# Patient Record
Sex: Female | Born: 1985 | Race: Black or African American | Hispanic: No | Marital: Single | Smoking: Never smoker
Health system: Southern US, Community
[De-identification: ages and names within clinical notes are randomized; demographics above are authoritative.]

## PROBLEM LIST (undated history)

## (undated) ENCOUNTER — Inpatient Hospital Stay (HOSPITAL_COMMUNITY): Payer: Self-pay

## (undated) HISTORY — PX: FOOT SURGERY: SHX648

---

## 2016-06-15 ENCOUNTER — Encounter: Payer: Self-pay | Admitting: Family Medicine

## 2016-06-15 ENCOUNTER — Ambulatory Visit (HOSPITAL_COMMUNITY)
Admission: EM | Admit: 2016-06-15 | Discharge: 2016-06-15 | Disposition: A | Discharge status: Home / Self Care | Payer: Medicaid Other

## 2016-06-15 ENCOUNTER — Ambulatory Visit (INDEPENDENT_AMBULATORY_CARE_PROVIDER_SITE_OTHER): Payer: Self-pay | Admitting: *Deleted

## 2016-06-15 DIAGNOSIS — Z3201 Encounter for pregnancy test, result positive: Secondary | ICD-10-CM

## 2016-06-15 DIAGNOSIS — Z3200 Encounter for pregnancy test, result unknown: Secondary | ICD-10-CM

## 2016-06-15 LAB — POCT PREGNANCY, URINE: Preg Test, Ur: POSITIVE — AB

## 2016-06-15 NOTE — Progress Notes (Signed)
Pt informed of +UPT today.  LMP 05/15/16 (normal).  EDD 02/19/17.  Pt states she last had intercourse (unprotected) on 05/03/16. She took Plan B within 24 hrs of the episode. Medication reconciliation completed. She is new to the area and desires prenatal care in this office.

## 2016-06-23 ENCOUNTER — Inpatient Hospital Stay (HOSPITAL_COMMUNITY)
Admission: AD | Admit: 2016-06-23 | Discharge: 2016-06-24 | Disposition: A | Discharge status: Home / Self Care | Payer: Medicaid Other | Source: Ambulatory Visit | Attending: Obstetrics & Gynecology | Admitting: Obstetrics & Gynecology

## 2016-06-23 ENCOUNTER — Encounter (HOSPITAL_COMMUNITY): Payer: Self-pay | Admitting: Vascular Surgery

## 2016-06-23 DIAGNOSIS — O00101 Right tubal pregnancy without intrauterine pregnancy: Secondary | ICD-10-CM | POA: Diagnosis present

## 2016-06-23 LAB — URINALYSIS, ROUTINE W REFLEX MICROSCOPIC
Bilirubin Urine: NEGATIVE
Glucose, UA: NEGATIVE mg/dL
KETONES UR: NEGATIVE mg/dL
Leukocytes, UA: NEGATIVE
Nitrite: NEGATIVE
Protein, ur: NEGATIVE mg/dL
Specific Gravity, Urine: 1.023 (ref 1.005–1.030)
pH: 6 (ref 5.0–8.0)

## 2016-06-23 LAB — COMPREHENSIVE METABOLIC PANEL
ALBUMIN: 3.9 g/dL (ref 3.5–5.0)
ALT: 12 U/L — AB (ref 14–54)
AST: 20 U/L (ref 15–41)
Alkaline Phosphatase: 47 U/L (ref 38–126)
Anion gap: 11 (ref 5–15)
BUN: 10 mg/dL (ref 6–20)
CHLORIDE: 99 mmol/L — AB (ref 101–111)
CO2: 25 mmol/L (ref 22–32)
Calcium: 9.5 mg/dL (ref 8.9–10.3)
Creatinine, Ser: 0.94 mg/dL (ref 0.44–1.00)
GFR calc non Af Amer: >60 mL/min (ref >60)
Glucose, Bld: 92 mg/dL (ref 65–99)
Potassium: 3.7 mmol/L (ref 3.5–5.1)
SODIUM: 135 mmol/L (ref 135–145)
Total Bilirubin: 1.2 mg/dL (ref 0.3–1.2)
Total Protein: 6.8 g/dL (ref 6.5–8.1)

## 2016-06-23 LAB — CBC
HCT: 38.2 % (ref 36.0–46.0)
HEMOGLOBIN: 13.2 g/dL (ref 12.0–15.0)
MCH: 31.9 pg (ref 26.0–34.0)
MCHC: 34.6 g/dL (ref 30.0–36.0)
MCV: 92.3 fL (ref 78.0–100.0)
PLATELETS: 304 10*3/uL (ref 150–400)
RBC: 4.14 MIL/uL (ref 3.87–5.11)
RDW: 13.6 % (ref 11.5–15.5)
WBC: 6 10*3/uL (ref 4.0–10.5)

## 2016-06-23 NOTE — ED Triage Notes (Signed)
Pt reports that today she started having some vaginal bleeding around 13:30. She states that she found out she was pregnant last week and she has been taking 3 weeks of birth control up until she found out last week. She denies any N/V. She does have some lower abd cramps and low back pain which are normal when she was premenstrual.

## 2016-06-24 ENCOUNTER — Telehealth (HOSPITAL_COMMUNITY): Payer: Self-pay

## 2016-06-24 ENCOUNTER — Emergency Department (HOSPITAL_COMMUNITY): Payer: Medicaid Other

## 2016-06-24 ENCOUNTER — Other Ambulatory Visit: Payer: Self-pay | Admitting: Student

## 2016-06-24 DIAGNOSIS — A749 Chlamydial infection, unspecified: Secondary | ICD-10-CM

## 2016-06-24 DIAGNOSIS — O00101 Right tubal pregnancy without intrauterine pregnancy: Secondary | ICD-10-CM

## 2016-06-24 LAB — WET PREP, GENITAL
SPERM: NONE SEEN
TRICH WET PREP: NONE SEEN
Yeast Wet Prep HPF POC: NONE SEEN

## 2016-06-24 LAB — ABO/RH: ABO/RH(D): A POS

## 2016-06-24 LAB — GC/CHLAMYDIA PROBE AMP (~~LOC~~) NOT AT ARMC
Chlamydia: POSITIVE — AB
Neisseria Gonorrhea: NEGATIVE

## 2016-06-24 LAB — HCG, QUANTITATIVE, PREGNANCY: HCG, BETA CHAIN, QUANT, S: 2451 m[IU]/mL — AB (ref <5)

## 2016-06-24 MED ORDER — METHOTREXATE INJECTION FOR WOMEN'S HOSPITAL
50.0000 mg/m2 | Freq: Once | INTRAMUSCULAR | Status: AC
  Administered 2016-06-24: 90 mg via INTRAMUSCULAR
  Filled 2016-06-24: qty 1.8

## 2016-06-24 MED ORDER — AZITHROMYCIN 250 MG PO TABS: 1000.0000 mg | ORAL_TABLET | Freq: Once | ORAL | 0 refills | Status: AC

## 2016-06-24 NOTE — Discharge Instructions (Signed)
Ectopic Pregnancy °An ectopic pregnancy is when the fertilized egg attaches (implants) outside the uterus. Most ectopic pregnancies occur in one of the tubes where eggs travel from the ovary to the uterus (fallopian tubes), but the implanting can occur in other locations. In rare cases, ectopic pregnancies occur on the ovary, intestine, pelvis, abdomen, or cervix. In an ectopic pregnancy, the fertilized egg does not have the ability to develop into a normal, healthy baby. °A ruptured ectopic pregnancy is one in which tearing or bursting of a fallopian tube causes internal bleeding. Often, there is intense lower abdominal pain, and vaginal bleeding sometimes occurs. Having an ectopic pregnancy can be life-threatening. If this dangerous condition is not treated, it can lead to blood loss, shock, or even death. °What are the causes? °The most common cause of this condition is damage to one of the fallopian tubes. A fallopian tube may be narrowed or blocked, and that keeps the fertilized egg from reaching the uterus. °What increases the risk? °This condition is more likely to develop in women of childbearing age who have different levels of risk. The levels of risk can be divided into three categories. °High risk  °· You have gone through infertility treatment. °· You have had an ectopic pregnancy before. °· You have had surgery on the fallopian tubes, or another surgical procedure, such as an abortion. °· You have had surgery to have the fallopian tubes tied (tubal ligation). °· You have problems or diseases of the fallopian tubes. °· You have been exposed to diethylstilbestrol (DES). This medicine was used until 1971, and it had effects on babies whose mothers took the medicine. °· You become pregnant while using an IUD (intrauterine device) for birth control. °Moderate risk  °· You have a history of infertility. °· You have had an STI (sexually transmitted infection). °· You have a history of pelvic inflammatory  disease (PID). °· You have scarring from endometriosis. °· You have multiple sexual partners. °· You smoke. °Low risk  °· You have had pelvic surgery. °· You use vaginal douches. °· You became sexually active before age 18. °What are the signs or symptoms? °Common symptoms of this condition include normal pregnancy symptoms, such as missing a period, nausea, tiredness, abdominal pain, breast tenderness, and bleeding. However, ectopic pregnancy will have additional symptoms, such as: °· Pain with intercourse. °· Irregular vaginal bleeding or spotting. °· Cramping or pain on one side or in the lower abdomen. °· Fast heartbeat, low blood pressure, and sweating. °· Passing out while having a bowel movement. °Symptoms of a ruptured ectopic pregnancy and internal bleeding may include: °· Sudden, severe pain in the abdomen and pelvis. °· Dizziness, weakness, light-headedness, or fainting. °· Pain in the shoulder or neck area. °How is this diagnosed? °This condition is diagnosed by: °· A pelvic exam to locate pain or a mass in the abdomen. °· A pregnancy test. This blood test checks for the presence as well as the specific level of pregnancy hormone in the bloodstream. °· Ultrasound. This is performed if a pregnancy test is positive. In this test, a probe is inserted into the vagina. The probe will detect a fetus, possibly in a location other than the uterus. °· Taking a sample of uterus tissue (dilation and curettage, or D&C). °· Surgery to perform a visual exam of the inside of the abdomen using a thin, lighted tube that has a tiny camera on the end (laparoscope). °· Culdocentesis. This procedure involves inserting a needle at the   top of the vagina, behind the uterus. If blood is present in this area, it may indicate that a fallopian tube is torn. How is this treated? This condition is treated with medicine or surgery. Medicine   An injection of a medicine (methotrexate) may be given to cause the pregnancy tissue to  be absorbed. This medicine may save your fallopian tube. It may be given if:  The diagnosis is made early, with no signs of active bleeding.  The fallopian tube has not ruptured.  You are considered to be a good candidate for the medicine. Usually, pregnancy hormone blood levels are checked after methotrexate treatment. This is to be sure that the medicine is effective. It may take 4-6 weeks for the pregnancy to be absorbed. Most pregnancies will be absorbed by 3 weeks. Surgery   A laparoscope may be used to remove the pregnancy tissue.  If severe internal bleeding occurs, a larger cut (incision) may be made in the lower abdomen (laparotomy) to remove the fetus and placenta. This is done to stop the bleeding.  Part or all of the fallopian tube may be removed (salpingectomy) along with the fetus and placenta. The fallopian tube may also be repaired during the surgery.  In very rare circumstances, removal of the uterus (hysterectomy) may be required.  After surgery, pregnancy hormone testing may be done to be sure that there is no pregnancy tissue left. Whether your treatment is medicine or surgery, you may receive a Rho (D) immune globulin shot to prevent problems with any future pregnancy. This shot may be given if:  You are Rh-negative and the baby's father is Rh-positive.  You are Rh-negative and you do not know the Rh type of the baby's father. Follow these instructions at home:  Rest and limit your activity after the procedure for as long as told by your health care provider.  Until your health care provider says that it is safe:  Do not lift anything that is heavier than 10 lb (4.5 kg), or the limit that your health care provider tells you.  Avoid physical exercise and any movement that requires effort (is strenuous).  To help prevent constipation:  Eat a healthy diet that includes fruits, vegetables, and whole grains.  Drink 6-8 glasses of water per day. Get help right  away if:  You develop worsening pain that is not relieved by medicine.  You have:  A fever or chills.  Vaginal bleeding.  Redness and swelling at the incision site.  Nausea and vomiting.  You feel dizzy or weak.  You feel light-headed or you faint. This information is not intended to replace advice given to you by your health care provider. Make sure you discuss any questions you have with your health care provider. Document Released: 04/07/2004 Document Revised: 10/28/2015 Document Reviewed: 09/30/2015 Elsevier Interactive Patient Education  2017 [employer]   Methotrexate Treatment for an Ectopic Pregnancy Methotrexate is a medicine that treats a pregnancy condition in which the fetus develops outside the uterus (ectopic pregnancy) by stopping the growth of the fertilized egg. It also helps your body absorb tissue from the egg. This takes between 2 weeks and 6 weeks. Most ectopic pregnancies can be successfully treated with methotrexate if they are detected early enough. Tell a health care provider about:  Any allergies you have.  All medicines you are taking, including vitamins, herbs, eye drops, creams, and over-the-counter medicines.  Any medical conditions you have. What are the risks? Generally, this is a safe  treatment. However, problems can occur, including:  Nausea.  Vomiting.  Diarrhea.  Abdominal cramping.  Mouth sores.  Increased vaginal bleeding or spotting.  Swelling or irritation of the lining of your lungs (pneumonitis).  Failed treatment and continuation of the pregnancy.  Liver damage.  Hair loss. There is still a risk of the ectopic pregnancy rupturing while using the methotrexate. What happens before the procedure? Before you take the medicine:  Liver tests, kidney tests, and a complete blood test are performed.  Blood tests are performed to measure the pregnancy hormone levels and to determine your blood type.  If you are  Rh-negative and the father is Rh-positive or his Rh type is not known, you will be given a Rho (D) immune globulin shot. What happens during the procedure? There are two methods that your health care provider may use to give you methotrexate.  One method involves a single dose or injection of the medicine.  Another method involves a series of doses given through several injections. What happens after the procedure?  You may have some abdominal cramping, vaginal bleeding, and fatigue in the first few days after taking methotrexate.  Blood tests will be taken for several weeks to check the pregnancy hormone levels. The blood tests are performed until there is no more pregnancy hormone detected in the blood. This information is not intended to replace advice given to you by your health care provider. Make sure you discuss any questions you have with your health care provider. Document Released: 02/22/2001 Document Revised: 08/06/2015 Document Reviewed: 12/17/2012 Elsevier Interactive Patient Education  2017 [employer].  Methotrexate Treatment for an Ectopic Pregnancy, Care After Refer to this sheet in the next few weeks. These instructions provide you with information on caring for yourself after your procedure. Your health care provider may also give you more specific instructions. Your treatment has been planned according to current medical practices, but problems sometimes occur. Call your health care provider if you have any problems or questions after your procedure. What can I expect after the procedure? You may have some abdominal cramping, vaginal bleeding, and fatigue in the first few days after taking methotrexate. Some other possible side effects of methotrexate include:  Nausea.  Vomiting.  Diarrhea.  Mouth sores.  Swelling or irritation of the lining of your lungs (pneumonitis).  Liver damage.  Hair loss. Follow these instructions at home: After you have received the  methotrexate medicine, you need to be careful of your activities and watch your condition for several weeks. It may take 1 week before your hormone levels return to normal. Activity   Do not have sexual intercourse until your health care provider says it is safe to do so.  You may resume your usual diet.  Limit strenuous activity.  Do not drink alcohol. General instructions   Do not take aspirin, ibuprofen, or naproxen (nonsteroidal anti-inflammatory drugs [NSAIDs]).  Do not take folic acid, prenatal vitamins, or other vitamins that contain folic acid.  Avoid traveling too far away from your health care provider.  Keep all follow-up visits as told by your health care provider. This is important. Contact a health care provider if:  You cannot control your nausea and vomiting.  You cannot control your diarrhea.  You have sores in your mouth and want treatment.  You need pain medicine for your abdominal pain.  You have a rash.  You are having a reaction to the medicine. Get help right away if:  You have increasing abdominal  or pelvic pain.  You notice increased bleeding.  You feel light-headed, or you faint.  You have shortness of breath.  Your heart rate increases.  You have a cough.  You have chills.  You have a fever. This information is not intended to replace advice given to you by your health care provider. Make sure you discuss any questions you have with your health care provider. Document Released: 02/17/2011 Document Revised: 08/06/2015 Document Reviewed: 12/17/2012 Elsevier Interactive Patient Education  2017 [employer].

## 2016-06-24 NOTE — ED Provider Notes (Signed)
[facility]-EMERGENCY DEPT Provider Note   CSN: [id number] Arrival date & time: 06/23/16  2254     History   Chief Complaint Chief Complaint  Patient presents with  . Vaginal Bleeding    HPI Daisy Cruz is a 31 y.o. G2P1 @  Approximately 4 weeks and 2 days gest. LMP 05/21/16. Patient reports that she had sex with her boyfriend last month and the condom broke but she took plan B within 48 hours. She went to her GYN and they started her on OC's. Patient reports they did not do a pregnancy test prior to starting the pills. Patient felt like she had pregnancy symptoms so took a HPT after she had been on the pills a couple weeks. The test was positive so she stopped the OC's. Patient has now moved to [place] and does not have an OB yet.   HPI  History reviewed. No pertinent past medical history.  There are no active problems to display for this patient.   Past Surgical History:  Procedure Laterality Date  . FOOT SURGERY Bilateral     OB History    Gravida Para Term Preterm AB Living   1             SAB TAB Ectopic Multiple Live Births                   Home Medications    Prior to Admission medications   Not on File    Family History No family history on file.  Social History Social History  Substance Use Topics  . Smoking status: Never Smoker  . Smokeless tobacco: Never Used  . Alcohol use Yes     Comment: occasionally, not since she found out she was preg     Allergies   Patient has no allergy information on record.   Review of Systems Review of Systems  Constitutional: Positive for chills. Negative for fever.  HENT: Negative for congestion, ear pain and sore throat.   Eyes: Negative for visual disturbance.  Respiratory: Negative for chest tightness and shortness of breath.   Cardiovascular: Negative for chest pain and leg swelling.  Gastrointestinal: Positive for abdominal pain, constipation and nausea. Negative for diarrhea and vomiting.    Genitourinary: Positive for vaginal bleeding. Negative for dysuria, frequency and urgency.  Musculoskeletal: Positive for back pain.  Skin: Negative for rash.  Neurological: Positive for light-headedness. Negative for headaches.  Psychiatric/Behavioral: Negative for confusion. The patient is not nervous/anxious.      Physical Exam Updated Vital Signs BP (!) 132/93 (BP Location: Right Arm)   Pulse 90   Temp 98 F (36.7 C) (Oral)   Resp 16   SpO2 99%   Physical Exam  Constitutional: She is oriented to person, place, and time. She appears well-developed and well-nourished. No distress.  HENT:  Head: Normocephalic.  Eyes: EOM are normal.  Neck: Neck supple.  Cardiovascular: Normal rate and regular rhythm.   Pulmonary/Chest: Effort normal and breath sounds normal.  Abdominal: Soft. Bowel sounds are normal.  Lower abdomen tender with palpation, no rebound or guarding.   Genitourinary:  Genitourinary Comments: External genitalia without lesions, small amount of blood vaginal vault, no CMT,mild right adnexal tenderness, uterus not enlarged.   Musculoskeletal: Normal range of motion. She exhibits no deformity.  Neurological: She is alert and oriented to person, place, and time. No cranial nerve deficit.  Skin: Skin is warm and dry.  Psychiatric: She has a normal mood and affect. Her behavior is  normal.  Nursing note and vitals reviewed.    ED Treatments / Results  Labs (all labs ordered are listed, but only abnormal results are displayed) Labs Reviewed  WET PREP, GENITAL - Abnormal; Notable for the following:       Result Value   Clue Cells Wet Prep HPF POC PRESENT (*)    WBC, Wet Prep HPF POC FEW (*)    All other components within normal limits  COMPREHENSIVE METABOLIC PANEL - Abnormal; Notable for the following:    Chloride 99 (*)    ALT 12 (*)    All other components within normal limits  URINALYSIS, ROUTINE W REFLEX MICROSCOPIC - Abnormal; Notable for the following:     APPearance HAZY (*)    Hgb urine dipstick LARGE (*)    Bacteria, UA RARE (*)    Squamous Epithelial / LPF 0-5 (*)    All other components within normal limits  HCG, QUANTITATIVE, PREGNANCY - Abnormal; Notable for the following:    hCG, Beta Chain, Quant, S 2,451 (*)    All other components within normal limits  CBC  ABO/RH  GC/CHLAMYDIA PROBE AMP ([place]) NOT AT [facility]   Radiology US Ob Comp Less 14 Wks  Result Date: 06/24/2016 CLINICAL DATA:  Acute onset of vaginal bleeding and pelvic cramping. Initial encounter. EXAM: OBSTETRIC <14 WK [place] AND TRANSVAGINAL OB US TECHNIQUE: Both transabdominal and transvaginal ultrasound examinations were performed for complete evaluation of the gestation as well as the maternal uterus, adnexal regions, and pelvic cul-de-sac. Transvaginal technique was performed to assess early pregnancy. COMPARISON:  None. FINDINGS: Intrauterine gestational sac:  None seen. Yolk sac:  N/A Embryo:  N/A Subchorionic hemorrhage:  None visualized. Maternal uterus/adnexae: An echogenic masslike focus is seen between the right ovary and the uterus, measuring approximately 2.5 x 1.5 x 1.7 cm. This appears to be separate from the right ovary, and raises concern for an ectopic pregnancy within the right fallopian tube. The uterus is unremarkable in appearance. The right ovary is unremarkable, measuring 3.6 x 2.7 x 2.6 cm. The left ovary is not visualized. No free fluid is seen at this time. IMPRESSION: 1. No intrauterine gestational sac seen at this time. 2. Concern for ectopic pregnancy near the right ovary, possibly within the right fallopian tube. Echogenic masslike focus measures 2.5 x 1.5 x 1.7 cm. No free fluid seen at this time. These results were called by telephone at the time of interpretation on 06/24/2016 at 3:38 am to [facility] NP, who verbally acknowledged these results. Electronically Signed   By: Roanna Raider M.D.   On: 06/24/2016 03:39   US Ob Transvaginal  Result  Date: 06/24/2016 CLINICAL DATA:  Acute onset of vaginal bleeding and pelvic cramping. Initial encounter. EXAM: OBSTETRIC <14 WK [place] AND TRANSVAGINAL OB US TECHNIQUE: Both transabdominal and transvaginal ultrasound examinations were performed for complete evaluation of the gestation as well as the maternal uterus, adnexal regions, and pelvic cul-de-sac. Transvaginal technique was performed to assess early pregnancy. COMPARISON:  None. FINDINGS: Intrauterine gestational sac:  None seen. Yolk sac:  N/A Embryo:  N/A Subchorionic hemorrhage:  None visualized. Maternal uterus/adnexae: An echogenic masslike focus is seen between the right ovary and the uterus, measuring approximately 2.5 x 1.5 x 1.7 cm. This appears to be separate from the right ovary, and raises concern for an ectopic pregnancy within the right fallopian tube. The uterus is unremarkable in appearance. The right ovary is unremarkable, measuring 3.6 x 2.7 x 2.6 cm. The  left ovary is not visualized. No free fluid is seen at this time. IMPRESSION: 1. No intrauterine gestational sac seen at this time. 2. Concern for ectopic pregnancy near the right ovary, possibly within the right fallopian tube. Echogenic masslike focus measures 2.5 x 1.5 x 1.7 cm. No free fluid seen at this time. These results were called by telephone at the time of interpretation on 06/24/2016 at 3:38 am to [facility] NP, who verbally acknowledged these results. Electronically Signed   By: Roanna Raider M.D.   On: 06/24/2016 03:39   3:45 am consult with Dr. Macon Large. Patient will go to [facility] for MTX for treatment of ectopic.   4:00 am spoke with Thressa Sheller, CNM in the MAU and discussed patient dx and treatment plan. She will see the patient in MAU.  Procedures Procedures (including critical care time)  Medications Ordered in ED Medications - No data to display   Initial Impression / Assessment and Plan / ED Course  I have reviewed the triage vital signs and the nursing  notes.  Pertinent labs & imaging results that were available during my care of the patient were reviewed by me and considered in my medical decision making (see chart for details).    Final Clinical Impressions(s) / ED Diagnoses  31 y.o. female with vaginal bleeding, spotting, and mild cramping stable for transfer to [facility] hospital via private car without hemorrhage and without acute abdomen. Discussed in detail with the patient plan of care which includes going to [facility] for treatment of the ectopic pregnancy. Patient voices understanding and agrees with plan. When blood is drawn prior to MTX they will also do a blood type.   Final diagnoses:  Right tubal pregnancy without intrauterine pregnancy    New Prescriptions New Prescriptions   No medications on file     [facility], NP 06/24/16 0429    Layla Maw Ward, DO 06/24/16 [phone]

## 2016-06-24 NOTE — MAU Provider Note (Signed)
History     CSN: [id number]  Arrival date and time: 06/23/16 2254   First Provider Initiated Contact with Patient 06/24/16 [phone]      Chief Complaint  Patient presents with  . Vaginal Bleeding   Vaginal Bleeding  The patient's primary symptoms include pelvic pain and vaginal bleeding. This is a new problem. The current episode started yesterday. Pain severity now: 5/10  The problem affects the right side. Associated symptoms include back pain and nausea. Pertinent negatives include no chills, fever or vomiting. The vaginal discharge was bloody. The vaginal bleeding is lighter than menses. She has not been passing clots. She has not been passing tissue. Nothing aggravates the symptoms. She has tried nothing for the symptoms. She uses oral contraceptives for contraception. Her menstrual history has been regular (LMP 05/22/16 approx. ).    History reviewed. No pertinent past medical history.  Past Surgical History:  Procedure Laterality Date  . FOOT SURGERY Bilateral     No family history on file.  Social History  Substance Use Topics  . Smoking status: Never Smoker  . Smokeless tobacco: Never Used  . Alcohol use Yes     Comment: occasionally, not since she found out she was preg    Allergies: Allergies not on file  No prescriptions prior to admission.    Review of Systems  Constitutional: Negative for chills and fever.  Gastrointestinal: Positive for nausea. Negative for vomiting.  Genitourinary: Positive for pelvic pain and vaginal bleeding.  Musculoskeletal: Positive for back pain.   Physical Exam   Blood pressure 114/74, pulse 76, temperature 98.2 F (36.8 C), resp. rate 16, SpO2 100 %.  Physical Exam  Nursing note and vitals reviewed. Constitutional: She is oriented to person, place, and time. She appears well-developed and well-nourished. No distress.  HENT:  Head: Normocephalic.  Cardiovascular: Normal rate.   Respiratory: Effort normal.  GI: Soft.   Musculoskeletal: Normal range of motion.  Neurological: She is alert and oriented to person, place, and time.  Skin: Skin is warm and dry.  Psychiatric: She has a normal mood and affect.     Results for orders placed or performed during the hospital encounter of 06/23/16 (from the past 24 hour(s))  Comprehensive metabolic panel     Status: Abnormal   Collection Time: 06/23/16 11:04 PM  Result Value Ref Range   Sodium 135 135 - 145 mmol/L   Potassium 3.7 3.5 - 5.1 mmol/L   Chloride 99 (L) 101 - 111 mmol/L   CO2 25 22 - 32 mmol/L   Glucose, Bld 92 65 - 99 mg/dL   BUN 10 6 - 20 mg/dL   Creatinine, Ser 4.69 0.44 - 1.00 mg/dL   Calcium 9.5 8.9 - 62.9 mg/dL   Total Protein 6.8 6.5 - 8.1 g/dL   Albumin 3.9 3.5 - 5.0 g/dL   AST 20 15 - 41 U/L   ALT 12 (L) 14 - 54 U/L   Alkaline Phosphatase 47 38 - 126 U/L   Total Bilirubin 1.2 0.3 - 1.2 mg/dL   GFR calc non Af Amer >60 >60 mL/min   GFR calc Af Amer >60 >60 mL/min   Anion gap 11 5 - 15  CBC     Status: None   Collection Time: 06/23/16 11:04 PM  Result Value Ref Range   WBC 6.0 4.0 - 10.5 K/uL   RBC 4.14 3.87 - 5.11 MIL/uL   Hemoglobin 13.2 12.0 - 15.0 g/dL   HCT 52.8 41.3 - 24.4 %  MCV 92.3 78.0 - 100.0 fL   MCH 31.9 26.0 - 34.0 pg   MCHC 34.6 30.0 - 36.0 g/dL   RDW 16.1 09.6 - 04.5 %   Platelets 304 150 - 400 K/uL  Urinalysis, Routine w reflex microscopic     Status: Abnormal   Collection Time: 06/23/16 11:04 PM  Result Value Ref Range   Color, Urine YELLOW YELLOW   APPearance HAZY (A) CLEAR   Specific Gravity, Urine 1.023 1.005 - 1.030   pH 6.0 5.0 - 8.0   Glucose, UA NEGATIVE NEGATIVE mg/dL   Hgb urine dipstick LARGE (A) NEGATIVE   Bilirubin Urine NEGATIVE NEGATIVE   Ketones, ur NEGATIVE NEGATIVE mg/dL   Protein, ur NEGATIVE NEGATIVE mg/dL   Nitrite NEGATIVE NEGATIVE   Leukocytes, UA NEGATIVE NEGATIVE   RBC / HPF 0-5 0 - 5 RBC/hpf   WBC, UA 0-5 0 - 5 WBC/hpf   Bacteria, UA RARE (A) NONE SEEN   Squamous Epithelial  / LPF 0-5 (A) NONE SEEN   Mucous PRESENT   hCG, quantitative, pregnancy     Status: Abnormal   Collection Time: 06/23/16 11:04 PM  Result Value Ref Range   hCG, Beta Chain, Quant, S 2,451 (H) <5 mIU/mL  Wet prep, genital     Status: Abnormal   Collection Time: 06/24/16 12:40 AM  Result Value Ref Range   Yeast Wet Prep HPF POC NONE SEEN NONE SEEN   Trich, Wet Prep NONE SEEN NONE SEEN   Clue Cells Wet Prep HPF POC PRESENT (A) NONE SEEN   WBC, Wet Prep HPF POC FEW (A) NONE SEEN   Sperm NONE SEEN   ABO/Rh     Status: None   Collection Time: 06/24/16 12:58 AM  Result Value Ref Range   ABO/RH(D) A POS    No rh immune globuloin NOT A RH IMMUNE GLOBULIN CANDIDATE, PT RH POSITIVE    US Ob Comp Less 14 Wks  Result Date: 06/24/2016 CLINICAL DATA:  Acute onset of vaginal bleeding and pelvic cramping. Initial encounter. EXAM: OBSTETRIC <14 WK [place] AND TRANSVAGINAL OB US TECHNIQUE: Both transabdominal and transvaginal ultrasound examinations were performed for complete evaluation of the gestation as well as the maternal uterus, adnexal regions, and pelvic cul-de-sac. Transvaginal technique was performed to assess early pregnancy. COMPARISON:  None. FINDINGS: Intrauterine gestational sac:  None seen. Yolk sac:  N/A Embryo:  N/A Subchorionic hemorrhage:  None visualized. Maternal uterus/adnexae: An echogenic masslike focus is seen between the right ovary and the uterus, measuring approximately 2.5 x 1.5 x 1.7 cm. This appears to be separate from the right ovary, and raises concern for an ectopic pregnancy within the right fallopian tube. The uterus is unremarkable in appearance. The right ovary is unremarkable, measuring 3.6 x 2.7 x 2.6 cm. The left ovary is not visualized. No free fluid is seen at this time. IMPRESSION: 1. No intrauterine gestational sac seen at this time. 2. Concern for ectopic pregnancy near the right ovary, possibly within the right fallopian tube. Echogenic masslike focus measures 2.5 x 1.5  x 1.7 cm. No free fluid seen at this time. These results were called by telephone at the time of interpretation on 06/24/2016 at 3:38 am to [facility] NP, who verbally acknowledged these results. Electronically Signed   By: Roanna Raider M.D.   On: 06/24/2016 03:39   US Ob Transvaginal  Result Date: 06/24/2016 CLINICAL DATA:  Acute onset of vaginal bleeding and pelvic cramping. Initial encounter. EXAM: OBSTETRIC <14 WK [place]  AND TRANSVAGINAL OB US TECHNIQUE: Both transabdominal and transvaginal ultrasound examinations were performed for complete evaluation of the gestation as well as the maternal uterus, adnexal regions, and pelvic cul-de-sac. Transvaginal technique was performed to assess early pregnancy. COMPARISON:  None. FINDINGS: Intrauterine gestational sac:  None seen. Yolk sac:  N/A Embryo:  N/A Subchorionic hemorrhage:  None visualized. Maternal uterus/adnexae: An echogenic masslike focus is seen between the right ovary and the uterus, measuring approximately 2.5 x 1.5 x 1.7 cm. This appears to be separate from the right ovary, and raises concern for an ectopic pregnancy within the right fallopian tube. The uterus is unremarkable in appearance. The right ovary is unremarkable, measuring 3.6 x 2.7 x 2.6 cm. The left ovary is not visualized. No free fluid is seen at this time. IMPRESSION: 1. No intrauterine gestational sac seen at this time. 2. Concern for ectopic pregnancy near the right ovary, possibly within the right fallopian tube. Echogenic masslike focus measures 2.5 x 1.5 x 1.7 cm. No free fluid seen at this time. These results were called by telephone at the time of interpretation on 06/24/2016 at 3:38 am to [facility] NP, who verbally acknowledged these results. Electronically Signed   By: Roanna Raider M.D.   On: 06/24/2016 03:39   MAU Course  Procedures  MDM R/B/A discussed at length with the patient. All questions answered. She agrees to MTX and follow up plan.   MTX given today. FU  Monday for day 4 labs  Assessment and Plan   1. Right tubal pregnancy without intrauterine pregnancy    DC home Comfort measures reviewed  Bleeding precautions Ectopic precautions RX: none  Return to MAU as needed FU with OB as planned  Follow-up Information    [facility].   Specialty:  Obstetrics and Gynecology Why:  Please come to the clinic monday morning around 0800 for repeat blood work.  Contact information: [address] [place] [address] [phone]           Tawnya Crook 06/24/2016, 5:10 AM

## 2016-06-24 NOTE — Telephone Encounter (Signed)

## 2016-06-24 NOTE — MAU Note (Signed)
PT  PRESENTS / TRANSFER   FROM  [facility]  AFTER   LABS AND  U/S -  TO RECEIVE METHOTREXATE

## 2016-06-27 ENCOUNTER — Telehealth: Payer: Self-pay | Admitting: General Practice

## 2016-06-27 ENCOUNTER — Ambulatory Visit: Payer: Medicaid Other | Admitting: General Practice

## 2016-06-27 DIAGNOSIS — O00101 Right tubal pregnancy without intrauterine pregnancy: Secondary | ICD-10-CM

## 2016-06-27 LAB — HCG, QUANTITATIVE, PREGNANCY: HCG, BETA CHAIN, QUANT, S: 1808 m[IU]/mL — AB (ref <5)

## 2016-06-27 NOTE — Telephone Encounter (Signed)
Called and informed patient of results and need to follow up on Thursday. Patient verbalized understanding and asked about starting her birth control. Told patient to wait until we follow her levels all the way down near 0. Patient verbalized understanding and had no questions

## 2016-06-27 NOTE — Progress Notes (Signed)
Patient here for stat bhcg for day 4 labs post MTX. Patient reports no pain but bleeding like a period. Per Donette Larry, patient does not need to wait for results- can be contacted by phone. Informed patient and she provided number [phone] for call back. Per Dr Macon Large, favorable decrease in bhcg levels- will follow up on Thursday for day 7 labs. Will call patient with results

## 2016-06-30 ENCOUNTER — Other Ambulatory Visit: Payer: Medicaid Other

## 2016-06-30 DIAGNOSIS — O3680X0 Pregnancy with inconclusive fetal viability, not applicable or unspecified: Secondary | ICD-10-CM

## 2016-07-01 LAB — BETA HCG QUANT (REF LAB): hCG Quant: 1092 m[IU]/mL

## 2016-07-05 ENCOUNTER — Telehealth: Payer: Self-pay | Admitting: *Deleted

## 2016-07-05 NOTE — Telephone Encounter (Addendum)
-----   Message from Tereso Newcomer, MD sent at 07/03/2016 10:50 AM EDT ----- Adequate drop in HCG.  Follow weekly HCGs until <5.  Please call to inform patient of results and recommendations.  Called pt to inform of the above results and message from Dr. Macon Large. Message left for her to call back and state whether a detailed message can be left on her voicemail.   4/25  1325  Called pt and informed her of BHCG test results from 4/19 and need for weekly repeat of test until the level is <5.  Pt voiced understanding and agreed to appt on 4/26 @ 0800 for BHCG.  She also asked for clarification if she should wait to resume birth control until the level has reduced mor. I advised that she should wait until we have stopped testing her blood (<5) to resume birth control. She also should abstain from sex until that time as well.  Pt voiced understanding.

## 2016-07-07 ENCOUNTER — Other Ambulatory Visit: Payer: Self-pay

## 2016-07-07 DIAGNOSIS — O00101 Right tubal pregnancy without intrauterine pregnancy: Secondary | ICD-10-CM

## 2016-07-08 LAB — BETA HCG QUANT (REF LAB): HCG QUANT: 406 m[IU]/mL

## 2016-07-12 ENCOUNTER — Telehealth: Payer: Self-pay | Admitting: General Practice

## 2016-07-12 NOTE — Telephone Encounter (Signed)
Telephone call to patient regarding BHCG results and need for f/u lab draw this week.No answer left message to call us back.

## 2016-07-19 NOTE — Telephone Encounter (Signed)
Called patient inform her of the test results and recommendation per [facility] patient plans to come in on 07/20/2016 for a repeat BHCG.

## 2016-07-20 ENCOUNTER — Ambulatory Visit: Payer: Medicaid Other | Admitting: *Deleted

## 2016-07-20 DIAGNOSIS — O3680X0 Pregnancy with inconclusive fetal viability, not applicable or unspecified: Secondary | ICD-10-CM

## 2016-07-21 LAB — BETA HCG QUANT (REF LAB): hCG Quant: 54 m[IU]/mL

## 2016-07-25 ENCOUNTER — Telehealth: Payer: Self-pay

## 2016-07-25 NOTE — Telephone Encounter (Signed)
Patient informed of hormone level and the need to come back this Friday 07/29/2016 for a lab draw.

## 2016-07-25 NOTE — Telephone Encounter (Signed)
-----   Message from Marny LowensteinJulie N Wenzel, PA-C sent at 07/21/2016  1:21 PM EDT ----- Patient's hCG continues to drop. Still need for follow until < 5, repeat in 1 week.   Thanks,  Raynelle FanningJulie

## 2016-07-29 ENCOUNTER — Other Ambulatory Visit: Payer: Medicaid Other

## 2016-07-29 DIAGNOSIS — O039 Complete or unspecified spontaneous abortion without complication: Secondary | ICD-10-CM

## 2016-07-30 LAB — BETA HCG QUANT (REF LAB): HCG QUANT: 7 m[IU]/mL

## 2016-08-04 ENCOUNTER — Telehealth: Payer: Self-pay | Admitting: *Deleted

## 2016-08-04 NOTE — Telephone Encounter (Addendum)
-----   Message from [name], MD sent at 08/01/2016  1:01 PM EDT ----- Needs one more HCG lab draw; can do it during appointment with any provider to discuss future pregnancy plans, contraception etc.  Please schedule appointment + lab draw in 1-2 weeks.  Please call to inform patient of results and recommendations.  5/24  1030  Called pt and heard message stating that the number called [phone]) is not reachable. Will try again at another time.   5/25  1135  Called pt and again heard message stating that the number called is not reachable.   5/31  Called pt and informed her of recent lab (BHCG) result. She will need one more check of the hormone level and this can be done at the same time as her follow up appt in the office. We will schedule the appt and notify her of the details.  Pt voiced understanding and requested to have a morning appt on any [name] as she needs to be to work @ 1130.

## 2016-08-17 ENCOUNTER — Encounter: Payer: Self-pay | Admitting: Obstetrics and Gynecology

## 2016-08-17 ENCOUNTER — Ambulatory Visit (INDEPENDENT_AMBULATORY_CARE_PROVIDER_SITE_OTHER): Payer: Medicaid Other | Admitting: Obstetrics and Gynecology

## 2016-08-17 ENCOUNTER — Ambulatory Visit (INDEPENDENT_AMBULATORY_CARE_PROVIDER_SITE_OTHER): Payer: Medicaid Other | Admitting: Clinical

## 2016-08-17 VITALS — BP 111/85 | HR 76 | Ht 63.0 in | Wt 158.7 lb

## 2016-08-17 DIAGNOSIS — F4320 Adjustment disorder, unspecified: Secondary | ICD-10-CM

## 2016-08-17 DIAGNOSIS — O008 Other ectopic pregnancy without intrauterine pregnancy: Secondary | ICD-10-CM | POA: Diagnosis not present

## 2016-08-17 DIAGNOSIS — F4321 Adjustment disorder with depressed mood: Secondary | ICD-10-CM

## 2016-08-17 DIAGNOSIS — O00109 Unspecified tubal pregnancy without intrauterine pregnancy: Secondary | ICD-10-CM

## 2016-08-17 NOTE — Progress Notes (Signed)
States just had a period and now no bleeding and no pain. States started taking birth control pill from previous provider on Sunday.

## 2016-08-17 NOTE — BH Specialist Note (Addendum)
error 

## 2016-08-17 NOTE — BH Specialist Note (Signed)
Integrated Behavioral Health Initial Visit  MRN: [id number] Name: Daisy Cruz   Session Start time: 3:40 Session End time: 4:10 Total time: 30 minutes  Type of Service: Integrated Behavioral Health- Individual/Family Interpretor:No. Interpretor Name and Language: n/a   Warm Hand Off Completed.       SUBJECTIVE: Daisy PrimroseJamisha Berne is a 31 y.o. female accompanied by patient. Patient was referred by Dr Jolayne Pantheronstant for loss. Patient reports the following symptoms/concerns: Pt states her primary concern is the need to talk aloud her feelings, as she grieves loss after ectopic pregnancy, along with adjusting to changing life circumstances. Duration of problem: Less than one month; Severity of problem: mild  OBJECTIVE: Mood: Anxious and Depressed and Affect: Tearful Risk of harm to self or others: No plan to harm self or others   LIFE CONTEXT: Family and Social: Lives with 5yo son family School/Work: Going to [employer] in fall Self-Care: Daily workouts, no substances Life Changes: Recent loss of ectopic pregnancy, [place] to stay with family while waiting for student housing  GOALS ADDRESSED: Patient will reduce symptoms of: stress and increase knowledge and/or ability of: stress reduction and also: Increase healthy adjustment to current life circumstances and Begin healthy grieving over loss   INTERVENTIONS: Mindfulness or Relaxation Training, Supportive Counseling and Link to [facility]  Standardized Assessments completed: GAD-7 and PHQ 9  ASSESSMENT: Patient currently experiencing Grief, adjustment to loss. Patient may benefit from brief therapeutic intervention regarding coping with grief, along with community resources.  PLAN: 1. Follow up with behavioral health clinician on : As needed 2. Behavioral recommendations:  -Continue daily workouts and positive outlook on life changes -Continue setting healthy relationship  boundaries -Consider adding CALM relaxation breathing exercises to daily routine and apps as additional self-coping strategies, for daily stress 3. Referral(s): [facility] 4. "From scale of 1-10, how likely are you to follow plan?": 10  Omari Koslosky C Sharnell Knight, LCSWA

## 2016-08-17 NOTE — Progress Notes (Signed)
31 yo G2P1011 here for MTX follow-up for the treatment of an ectopic pregnancy. Patient reports feeling well and experienced a normal period at the end of May. She plans to resume her birth control pills.  She is without questions or concerns  No past medical history on file. Past Surgical History:  Procedure Laterality Date  . FOOT SURGERY Bilateral    No family history on file. Social History  Substance Use Topics  . Smoking status: Never Smoker  . Smokeless tobacco: Never Used  . Alcohol use Yes     Comment: occasionally, not since she found out she was preg   ROS See pertinent in HPI  Blood pressure 111/85, pulse 76, height 5\' 3"  (1.6 m), weight 158 lb 11.2 oz (72 kg), last menstrual period 08/03/2016. GENERAL: Well-developed, well-nourished female in no acute distress.  NEURO: alert and oriented x 3  A/P 31 yo s/p MTX for the treatment of ectopic pregnancy - Quant HCG today (last quant a week ago was 7) - Restart OCP - Follow up with PCP for annual exam as scheduled - patient will be contacted with abnormal results

## 2016-08-18 ENCOUNTER — Telehealth: Payer: Self-pay | Admitting: *Deleted

## 2016-08-18 LAB — BETA HCG QUANT (REF LAB): hCG Quant: <1 m[IU]/mL

## 2016-08-18 NOTE — Telephone Encounter (Signed)
Called patient per Dr Deretha Emoryonstant's request to notify her that her pregnancy has completely resolved. She can resume taking OCPs at this time. Patient voiced understanding.

## 2017-07-20 IMAGING — US US OB COMP LESS 14 WK
1 series · 13 of 28 positions shown · non-contrast
Comparison: None.

CLINICAL DATA: Acute onset of vaginal bleeding and pelvic cramping.
Initial encounter.

EXAM:
OBSTETRIC <14 WK US AND TRANSVAGINAL OB US
TECHNIQUE: Both transabdominal and transvaginal ultrasound examinations were
performed for complete evaluation of the gestation as well as the
maternal uterus, adnexal regions, and pelvic cul-de-sac.
Transvaginal technique was performed to assess early pregnancy.

[Series 1: us ob comp less 14 wk · 0.25mm/px · 13 of 76 slices shown]
[im 3/76]
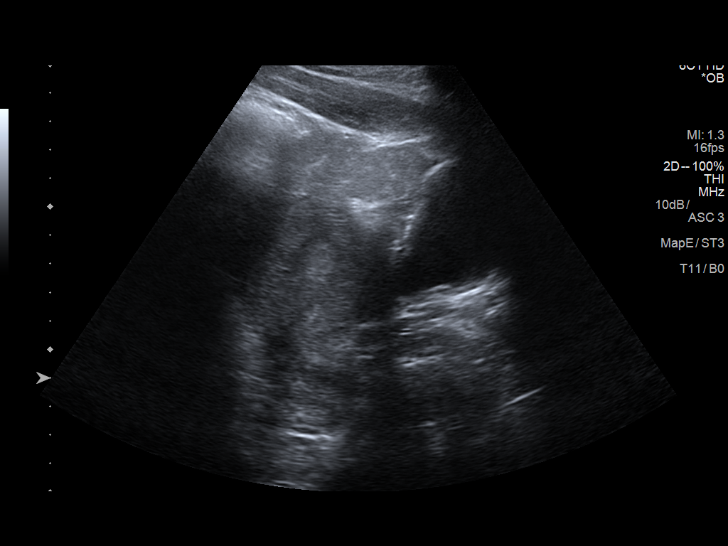
[im 9/76]
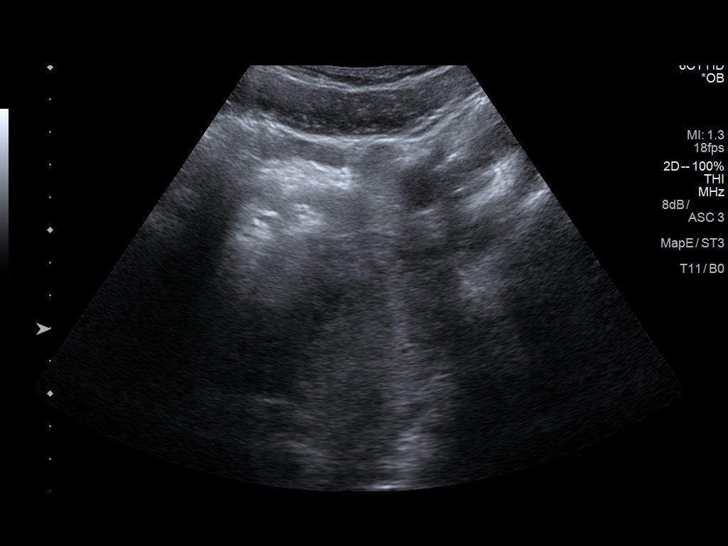
[im 14/76]
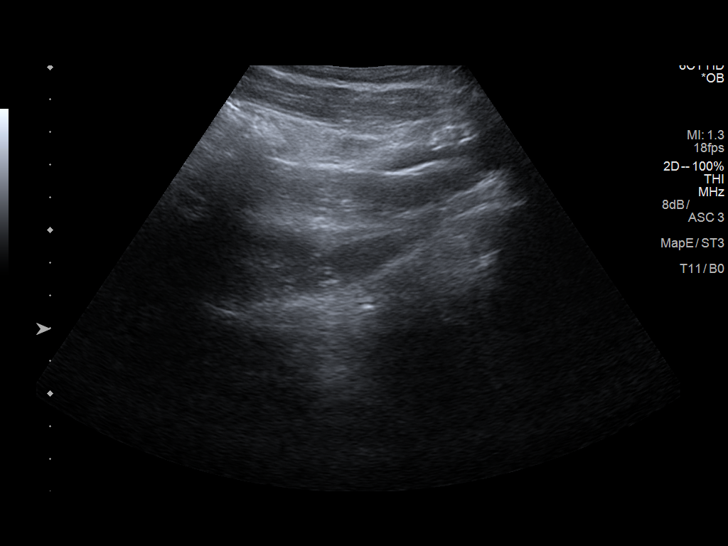
[im 20/76]
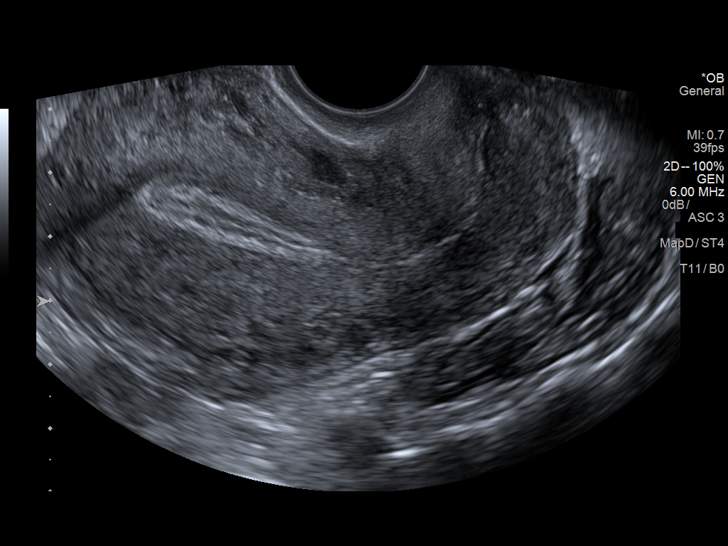
[im 26/76]
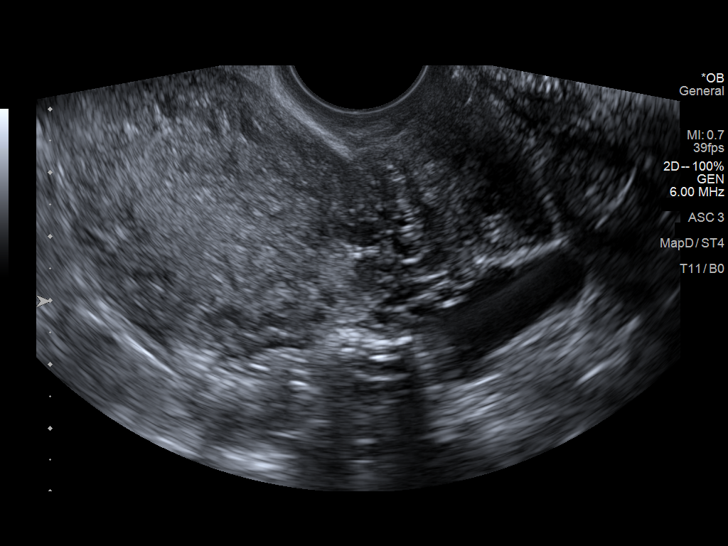
[im 31/76]
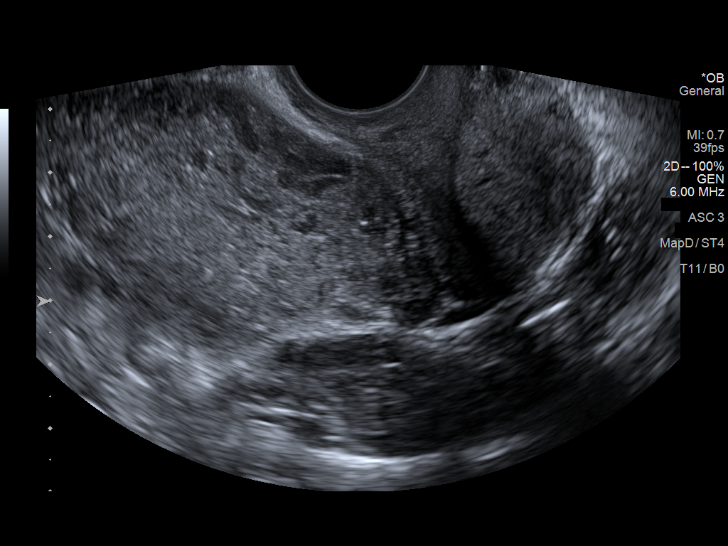
[im 39/76]
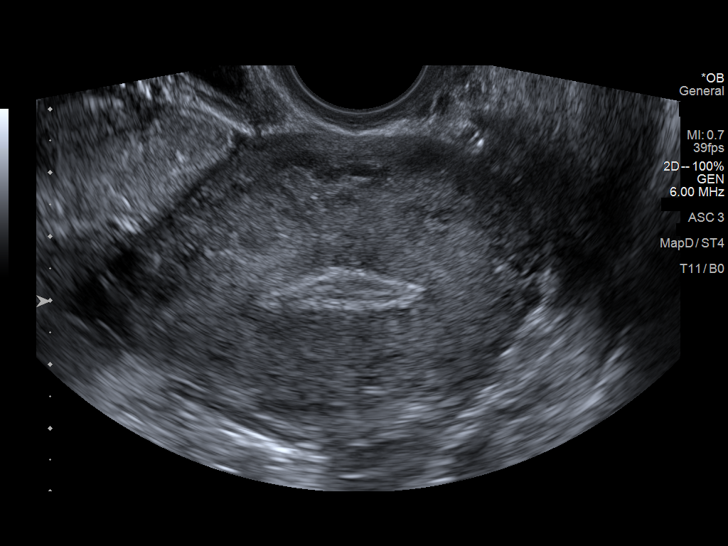
[im 45/76]
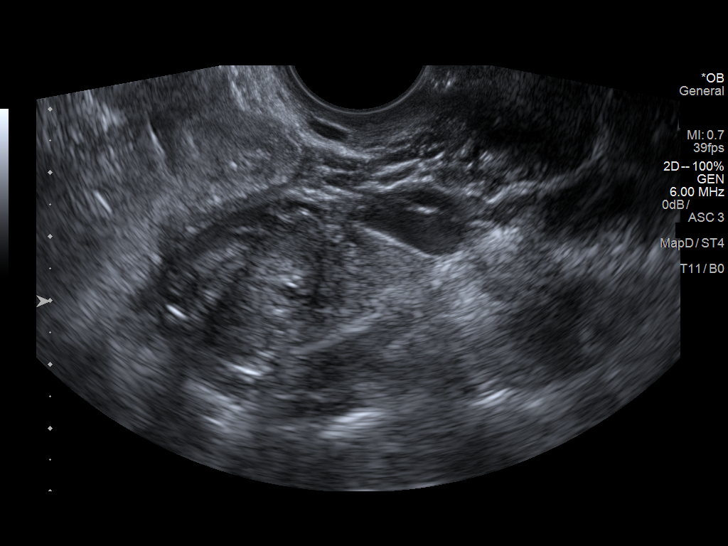
[im 51/76]
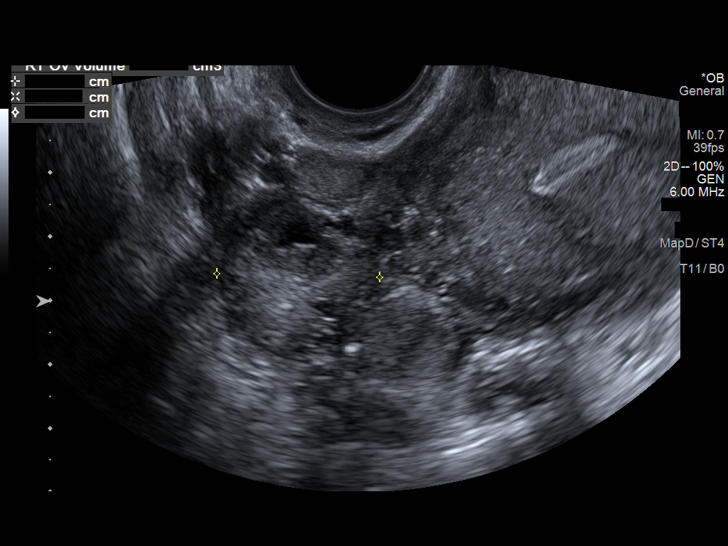
[im 56/76]
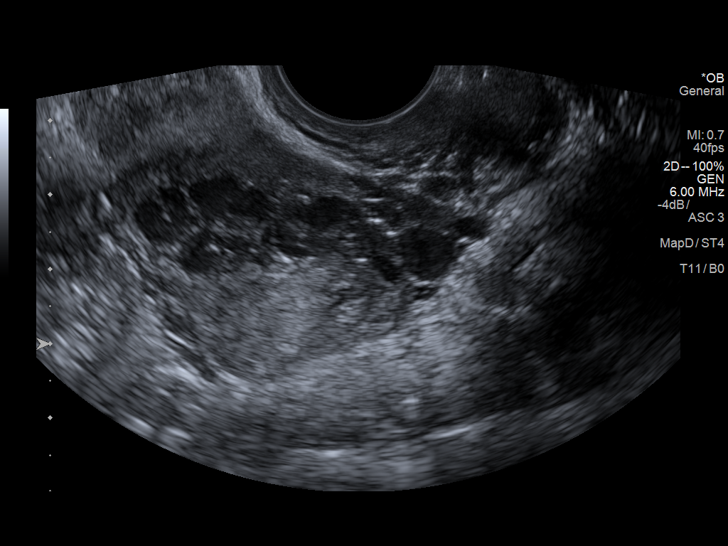
[im 62/76]
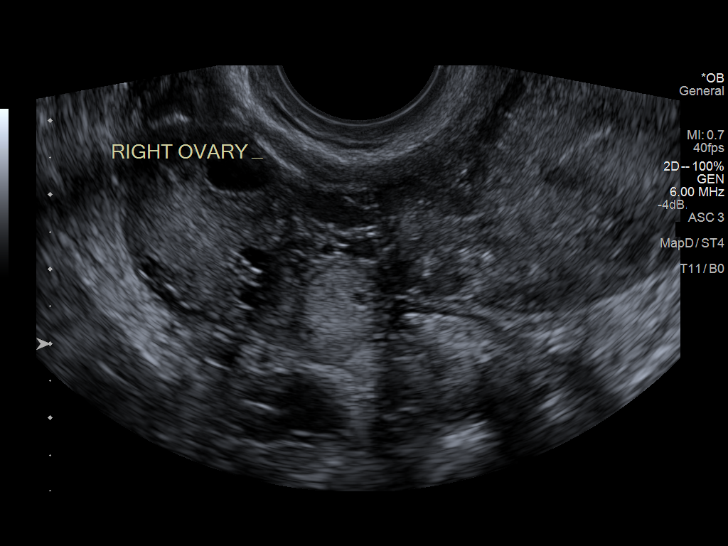
[im 67/76]
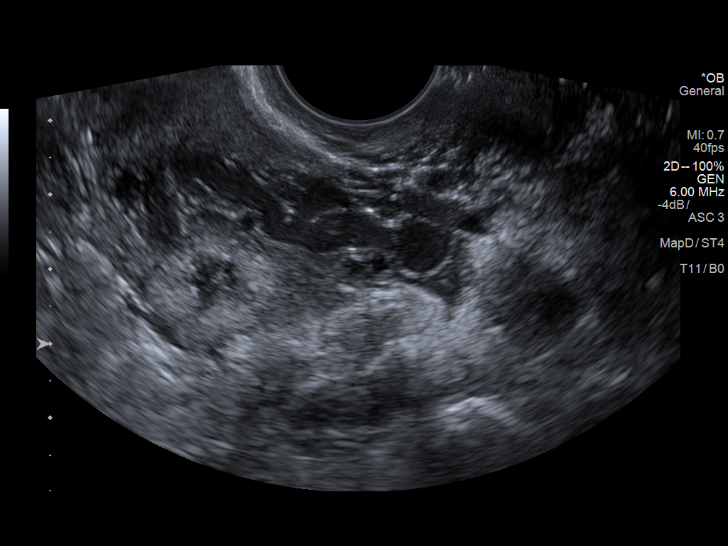
[im 73/76]
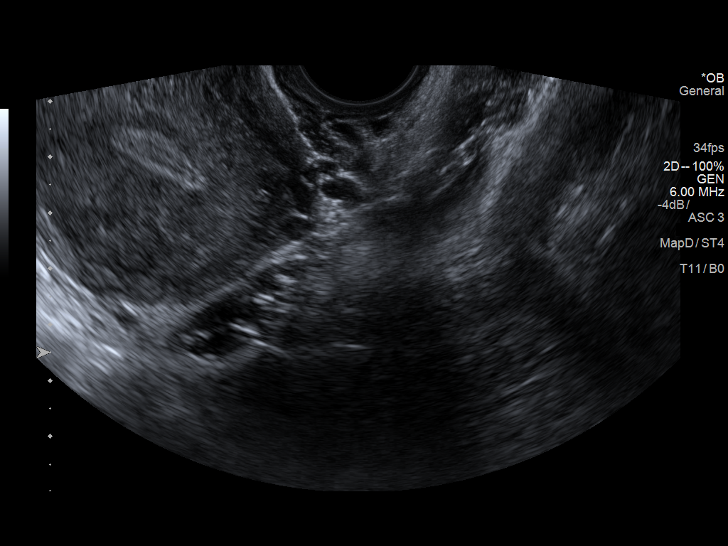

[13 of 28 positions shown; findings below may reference images not displayed]

FINDINGS: Intrauterine gestational sac:  None seen.

Yolk sac:  N/A

Embryo:  N/A

Subchorionic hemorrhage:  None visualized.

Maternal uterus/adnexae: An echogenic masslike focus is seen between
the right ovary and the uterus, measuring approximately 2.5 x 1.5 x
1.7 cm. This appears to be separate from the right ovary, and raises
concern for an ectopic pregnancy within the right fallopian tube.

The uterus is unremarkable in appearance. The right ovary is
unremarkable, measuring 3.6 x 2.7 x 2.6 cm. The left ovary is not
visualized.

No free fluid is seen at this time.
IMPRESSION: 1. No intrauterine gestational sac seen at this time.
2. Concern for ectopic pregnancy near the right ovary, possibly
within the right fallopian tube. Echogenic masslike focus measures
2.5 x 1.5 x 1.7 cm. No free fluid seen at this time.
These results were called by telephone at the time of interpretation
on 06/24/2016 at [DATE] to DEIMANTO JUC NP, who verbally acknowledged
these results.

## 2020-08-17 ENCOUNTER — Ambulatory Visit (HOSPITAL_COMMUNITY)
Admission: EM | Admit: 2020-08-17 | Discharge: 2020-08-17 | Disposition: A | Discharge status: Home / Self Care | Payer: Medicaid Other | Attending: Internal Medicine | Admitting: Internal Medicine

## 2020-08-17 ENCOUNTER — Encounter (HOSPITAL_COMMUNITY): Payer: Self-pay

## 2020-08-17 DIAGNOSIS — R21 Rash and other nonspecific skin eruption: Secondary | ICD-10-CM

## 2020-08-17 NOTE — Discharge Instructions (Signed)
Please abstain from sexual intercourse We will call you with recommendations if labs are abnormal Return to urgent care if you have worsening symptoms.

## 2020-08-17 NOTE — ED Provider Notes (Signed)
[facility]    CSN: [id number] Arrival date & time: 08/17/20  1450      History   Chief Complaint Chief Complaint  Patient presents with  . Rash    HPI Daisy Cruz is a 35 y.o. female comes to the urgent care with concerns for painful vaginal rash of 5 days duration.  Patient noticed the rash 5 days ago and has been persistent.  No vaginal discharge.  Patient is sexually active with 1 sexual partner.  She engages in unprotected sexual intercourse.  His sexual partner has another female partner.  No fever or chills.  No dysuria, urgency or frequency.   HPI  History reviewed. No pertinent past medical history.  There are no problems to display for this patient.   Past Surgical History:  Procedure Laterality Date  . FOOT SURGERY Bilateral     OB History    Gravida  2   Para  1   Term  1   Preterm      AB  0   Living        SAB      IAB      Ectopic      Multiple      Live Births               Home Medications    Prior to Admission medications   Medication Sig Start Date End Date Taking? Authorizing Provider  PRESCRIPTION MEDICATION 1 tablet daily.    [provider]    Family History History reviewed. No pertinent family history.  Social History Social History   Tobacco Use  . Smoking status: Never Smoker  . Smokeless tobacco: Never Used  Substance Use Topics  . Alcohol use: Yes    Comment: occasionally, not since she found out she was preg  . Drug use: No     Allergies   Penicillins   Review of Systems Review of Systems  Respiratory: Negative.   Cardiovascular: Negative.   Gastrointestinal: Negative.   Genitourinary: Positive for vaginal pain. Negative for dysuria, frequency, vaginal bleeding and vaginal discharge.     Physical Exam Triage Vital Signs ED Triage Vitals  Enc Vitals Group     BP 08/17/20 1541 115/69     Pulse Rate 08/17/20 1541 61     Resp 08/17/20 1541 18     Temp 08/17/20 1541  (!) 97.3 F (36.3 C)     Temp Source 08/17/20 1541 Oral     SpO2 08/17/20 1541 100 %     Weight --      Height --      Head Circumference --      Peak Flow --      Pain Score 08/17/20 1544 3     Pain Loc --      Pain Edu? --      Excl. in GC? --    No data found.  Updated Vital Signs BP 115/69 (BP Location: Right Arm)   Pulse 61   Temp (!) 97.3 F (36.3 C) (Oral)   Resp 18   LMP 07/27/2020   SpO2 100%   Breastfeeding Unknown   Visual Acuity Right Eye Distance:   Left Eye Distance:   Bilateral Distance:    Right Eye Near:   Left Eye Near:    Bilateral Near:     Physical Exam Vitals and nursing note reviewed. Exam conducted with a chaperone present.  Cardiovascular:     Pulses: Normal  pulses.     Heart sounds: Normal heart sounds.  Abdominal:     General: Bowel sounds are normal.     Palpations: Abdomen is soft.  Genitourinary:    General: Normal vulva.     Comments: Isolated shallow ulceration noticed on the right labia minora.  No vaginal discharge. Neurological:     Mental Status: She is alert.      UC Treatments / Results  Labs (all labs ordered are listed, but only abnormal results are displayed) Labs Reviewed  HERPES SIMPLEX VIRUS(HSV) DNA BY PCR  CERVICOVAGINAL ANCILLARY ONLY    EKG   Radiology No results found.  Procedures Procedures (including critical care time)  Medications Ordered in UC Medications - No data to display  Initial Impression / Assessment and Plan / UC Course  I have reviewed the triage vital signs and the nursing notes.  Pertinent labs & imaging results that were available during my care of the patient were reviewed by me and considered in my medical decision making (see chart for details).     1.  Genital rash: Cervico vaginal swab for GC/chlamydia/trichomonas/bacterial vaginosis/vaginal yeast Herpes PCR Patient is advised to abstain from sexual intercourse until labs are available Safe sex practices  recommended. Final Clinical Impressions(s) / UC Diagnoses   Final diagnoses:  Rash of genital area     Discharge Instructions     Please abstain from sexual intercourse We will call you with recommendations if labs are abnormal Return to urgent care if you have worsening symptoms.   ED Prescriptions    None     PDMP not reviewed this encounter.   Merrilee Jansky, MD 08/17/20 [phone]

## 2020-08-17 NOTE — ED Triage Notes (Signed)
Pt presents bumps on vaginal area X 5 days that is sore and slightly itchy.

## 2020-08-18 LAB — CERVICOVAGINAL ANCILLARY ONLY
Bacterial Vaginitis (gardnerella): POSITIVE — AB
Candida Glabrata: NEGATIVE
Candida Vaginitis: POSITIVE — AB
Chlamydia: NEGATIVE
Comment: NEGATIVE
Comment: NEGATIVE
Comment: NEGATIVE
Comment: NEGATIVE
Comment: NEGATIVE
Comment: NORMAL
Neisseria Gonorrhea: NEGATIVE
Trichomonas: NEGATIVE

## 2020-08-19 ENCOUNTER — Telehealth (HOSPITAL_COMMUNITY): Payer: Self-pay | Admitting: Emergency Medicine

## 2020-08-19 MED ORDER — FLUCONAZOLE 150 MG PO TABS: 150.0000 mg | ORAL_TABLET | Freq: Once | ORAL | 0 refills | Status: AC

## 2020-08-19 MED ORDER — METRONIDAZOLE 500 MG PO TABS
500.0000 mg | ORAL_TABLET | Freq: Two times a day (BID) | ORAL | 0 refills | Status: DC
Start: 2020-08-19 — End: 2020-08-21

## 2020-08-20 ENCOUNTER — Telehealth (HOSPITAL_COMMUNITY): Payer: Self-pay | Admitting: Emergency Medicine

## 2020-08-20 LAB — HERPES SIMPLEX VIRUS(HSV) DNA BY PCR
HSV 1 DNA: POSITIVE — AB
HSV 2 DNA: NEGATIVE

## 2020-08-20 MED ORDER — ACYCLOVIR 400 MG PO TABS: 400.0000 mg | ORAL_TABLET | Freq: Three times a day (TID) | ORAL | 0 refills | Status: DC

## 2020-08-21 ENCOUNTER — Telehealth (HOSPITAL_COMMUNITY): Payer: Self-pay | Admitting: Emergency Medicine

## 2020-08-21 MED ORDER — METRONIDAZOLE 500 MG PO TABS: 500.0000 mg | ORAL_TABLET | Freq: Two times a day (BID) | ORAL | 0 refills | Status: AC

## 2020-08-21 MED ORDER — ACYCLOVIR 400 MG PO TABS: 400.0000 mg | ORAL_TABLET | Freq: Three times a day (TID) | ORAL | 0 refills | Status: AC

## 2020-08-21 NOTE — Telephone Encounter (Signed)
Patient requested prescriptions be sent to a different pharmacy
